# Patient Record
Sex: Female | Born: 1960 | Race: White | Hispanic: No | Marital: Married | Smoking: Never smoker
Health system: Southern US, Community
[De-identification: ages and names within clinical notes are randomized; demographics above are authoritative.]

## PROBLEM LIST (undated history)

## (undated) DIAGNOSIS — E119 Type 2 diabetes mellitus without complications: Secondary | ICD-10-CM

## (undated) DIAGNOSIS — K5790 Diverticulosis of intestine, part unspecified, without perforation or abscess without bleeding: Secondary | ICD-10-CM

## (undated) DIAGNOSIS — I10 Essential (primary) hypertension: Secondary | ICD-10-CM

## (undated) DIAGNOSIS — E039 Hypothyroidism, unspecified: Secondary | ICD-10-CM

## (undated) DIAGNOSIS — E785 Hyperlipidemia, unspecified: Secondary | ICD-10-CM

## (undated) HISTORY — DX: Diverticulosis of intestine, part unspecified, without perforation or abscess without bleeding: K57.90

## (undated) HISTORY — PX: COLONOSCOPY: SHX174

## (undated) HISTORY — DX: Type 2 diabetes mellitus without complications: E11.9

## (undated) HISTORY — DX: Hyperlipidemia, unspecified: E78.5

## (undated) HISTORY — DX: Hypothyroidism, unspecified: E03.9

## (undated) HISTORY — DX: Essential (primary) hypertension: I10

## (undated) HISTORY — PX: CHOLECYSTECTOMY: SHX55

## (undated) HISTORY — PX: CARDIAC CATHETERIZATION: SHX172

---

## 2017-10-08 DIAGNOSIS — E119 Type 2 diabetes mellitus without complications: Secondary | ICD-10-CM

## 2017-10-08 DIAGNOSIS — I10 Essential (primary) hypertension: Secondary | ICD-10-CM

## 2017-10-08 DIAGNOSIS — E785 Hyperlipidemia, unspecified: Secondary | ICD-10-CM

## 2017-10-08 DIAGNOSIS — K5790 Diverticulosis of intestine, part unspecified, without perforation or abscess without bleeding: Secondary | ICD-10-CM

## 2017-10-08 DIAGNOSIS — E039 Hypothyroidism, unspecified: Secondary | ICD-10-CM

## 2017-10-08 HISTORY — DX: Type 2 diabetes mellitus without complications: E11.9

## 2017-10-08 HISTORY — DX: Essential (primary) hypertension: I10

## 2017-10-08 HISTORY — DX: Hyperlipidemia, unspecified: E78.5

## 2017-10-08 HISTORY — DX: Diverticulosis of intestine, part unspecified, without perforation or abscess without bleeding: K57.90

## 2017-10-08 HISTORY — DX: Hypothyroidism, unspecified: E03.9

## 2017-11-19 ENCOUNTER — Ambulatory Visit: Payer: BC Managed Care – PPO | Admitting: Cardiology

## 2021-01-19 NOTE — Progress Notes (Signed)
Cardiology Office Note:    Date:  01/21/2021   ID:  Molly Wilkins, DOB 11/26/1960, MRN [id number]  PCP:  Alinda Deem, MD  Cardiologist:  Norman Herrlich, MD   Referring MD: Alinda Deem, MD  ASSESSMENT:    1. Chest pain, unspecified type   2. Primary hypertension   3. Mixed hyperlipidemia   4. Prediabetes    PLAN:    In order of problems listed above:  1. Is a difficult visit being unable to access her old records however I do not think she had significant CAD from their descriptions and I feel comfortable be able to access her old heart catheterization report.  Moving forward she will undergo cardiac CTA to define the presence or absence of CAD and guide further evaluation and treatment.  She is comfortable with the approach and knowledgeable that it may be delayed if we do have a serious shortage of contrast dye in the next 4 to 6 weeks. 2. Stable BP at target continue current treatment including ACE inhibitor 3. Stable continue her statin 4. Stable continue metformin, encouraged her to find other ways for activity including pool programs for recumbent bike with her hip and knee limitations  Next appointment 8 weeks  Medication Adjustments/Labs and Tests Ordered: Current medicines are reviewed at length with the patient today.  Concerns regarding medicines are outlined above.  Orders Placed This Encounter  Procedures  . CT CORONARY MORPH W/CTA COR W/SCORE W/CA W/CM &/OR WO/CM  . EKG 12-Lead   Meds ordered this encounter  Medications  . metoprolol tartrate (LOPRESSOR) 100 MG tablet    Sig: Take 1 tablet (100 mg total) by mouth once for 1 dose. Take two hours prior to your cardiac CT    Dispense:  1 tablet    Refill:  0     Chief Complaint  Patient presents with  . Chest Pain    History of Present Illness:    Molly Wilkins is a 60 y.o. female with a history of hypertension and hyperlipidemia who is being seen today for the evaluation of chest pain at the request of  Alinda Deem, MD.  Review office note from primary care physician 12/20/2020 relates that 3-year duration of recurrent chest pain.  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  Her husband is present participates in evaluation decision making. Their memory and she was told that her heart catheterization was good for her age there is they are unsure whether there is any particular abnormality was never told she had coronary artery disease. I think we will be able to access the report and its requested from [facility]. She had seen my partner Dr. Tomie China in the past She has retired from a very stressful job and is concerned about her cardiac prognosis. Although she does not have typical exertional angina or shortness of breath she has a great deal of exercise intolerance related to hip and knee pain at times just vaguely does not feel well can have momentary chest discomfort describes it as pins-and-needles with and without activity and some element of shortness of breath.  She is concerned that stress is the cause of her symptoms. She has had good medical care her lipids are at target last LDL 89 on high intensity statin A1c 6.3% and blood pressure at target.  She has been maintained on aspirin along with a statin. They would both like further evaluation. We reviewed the options including noninvasive imaging versus cardiac  CTA and we will go ahead and schedule as an outpatient for CTA evaluation.  She is aware that there is a Teacher, early years/pre of contrast dye and may be delayed 6 to 8 weeks and we all feel comfortable that she does not have unstable symptoms. She has no known history of congenital or rheumatic heart disease. At times she has intermittent edema no orthopnea no palpitation or syncope.  Past Medical History:  Diagnosis Date  . Diabetes mellitus (HCC) 10/08/2017  . Diverticulosis 10/08/2017  . Hyperlipidemia 10/08/2017  .  Hypertension 10/08/2017  . Hypothyroidism 10/08/2017    Past Surgical History:  Procedure Laterality Date  . CARDIAC CATHETERIZATION    . CHOLECYSTECTOMY    . COLONOSCOPY      Current Medications: Current Meds  Medication Sig  . aspirin EC 81 MG tablet Take 81 mg by mouth daily. Swallow whole.  . Coenzyme Q10 (CO Q-10) 50 MG CAPS Take 1 capsule by mouth daily.  Marland Kitchen levothyroxine (SYNTHROID) 112 MCG tablet Take 112 mcg by mouth every morning.  Marland Kitchen lisinopril (ZESTRIL) 10 MG tablet Take 1 tablet by mouth daily.  . melatonin 5 MG TABS Take 5 mg by mouth at bedtime as needed (INSOMNIA).  Marland Kitchen metFORMIN (GLUCOPHAGE) 500 MG tablet Take 1 tablet by mouth 2 (two) times daily.  . metoprolol tartrate (LOPRESSOR) 100 MG tablet Take 1 tablet (100 mg total) by mouth once for 1 dose. Take two hours prior to your cardiac CT  . Misc Natural Products (LUTEIN 20 PO) Take 1 tablet by mouth daily.  . rosuvastatin (CRESTOR) 20 MG tablet Take 20 mg by mouth daily.     Allergies:   Patient has no known allergies.   Social History   Socioeconomic History  . Marital status: Married    Spouse name: Not on file  . Number of children: Not on file  . Years of education: Not on file  . Highest education level: Not on file  Occupational History  . Not on file  Tobacco Use  . Smoking status: Never Smoker  . Smokeless tobacco: Never Used  Vaping Use  . Vaping Use: Never used  Substance and Sexual Activity  . Alcohol use: Yes    Comment: rarely  . Drug use: No  . Sexual activity: Not on file  Other Topics Concern  . Not on file  Social History Narrative  . Not on file   Social Determinants of Health   Financial Resource Strain: Not on file  Food Insecurity: Not on file  Transportation Needs: Not on file  Physical Activity: Not on file  Stress: Not on file  Social Connections: Not on file     Family History: The patient's family history includes CAD in her mother; COPD in her mother;  Hyperlipidemia in her mother; Hypertension in her brother and mother.  ROS:   ROS Please see the history of present illness.     All other systems reviewed and are negative.  EKGs/Labs/Other Studies Reviewed:    The following studies were reviewed today:   EKG:  EKG is  ordered today.  The ekg ordered today is personally reviewed and demonstrates sinus rhythm nonspecific T waves    Physical Exam:    VS:  BP (!) 114/58 (BP Location: Right Arm, Patient Position: Sitting)   Pulse 68   Ht 5\' 4"  (1.626 m)   Wt 228 lb 1.3 oz (103.5 kg)   SpO2 97%   BMI 39.15 kg/m  Wt Readings from Last 3 Encounters:  01/21/21 228 lb 1.3 oz (103.5 kg)     GEN: Obese well nourished, well developed in no acute distress HEENT: Normal NECK: No JVD; No carotid bruits LYMPHATICS: No lymphadenopathy CARDIAC: RRR, no murmurs, rubs, gallops RESPIRATORY:  Clear to auscultation without rales, wheezing or rhonchi  ABDOMEN: Soft, non-tender, non-distended MUSCULOSKELETAL:  No edema; No deformity  SKIN: Warm and dry NEUROLOGIC:  Alert and oriented x 3 PSYCHIATRIC:  Normal affect     Signed, Norman Herrlich, MD  01/21/2021 12:02 PM    [facility]

## 2021-01-21 ENCOUNTER — Ambulatory Visit: Payer: BC Managed Care – PPO | Admitting: Cardiology

## 2021-01-21 ENCOUNTER — Other Ambulatory Visit: Payer: Self-pay

## 2021-01-21 ENCOUNTER — Encounter: Payer: Self-pay | Admitting: Cardiology

## 2021-01-21 VITALS — BP 114/58 | HR 68 | Ht 64.0 in | Wt 228.1 lb

## 2021-01-21 DIAGNOSIS — R7303 Prediabetes: Secondary | ICD-10-CM | POA: Diagnosis not present

## 2021-01-21 DIAGNOSIS — E782 Mixed hyperlipidemia: Secondary | ICD-10-CM

## 2021-01-21 DIAGNOSIS — I10 Essential (primary) hypertension: Secondary | ICD-10-CM | POA: Diagnosis not present

## 2021-01-21 DIAGNOSIS — R079 Chest pain, unspecified: Secondary | ICD-10-CM

## 2021-01-21 MED ORDER — METOPROLOL TARTRATE 100 MG PO TABS: 100.0000 mg | ORAL_TABLET | Freq: Once | ORAL | 0 refills | Status: DC

## 2021-01-21 NOTE — Patient Instructions (Addendum)
Medication Instructions:  Your physician recommends that you continue on your current medications as directed. Please refer to the Current Medication list given to you today.  *If you need a refill on your cardiac medications before your next appointment, please call your pharmacy*   Lab Work: None If you have labs (blood work) drawn today and your tests are completely normal, you will receive your results only by: Marland Kitchen MyChart Message (if you have MyChart) OR . A paper copy in the mail If you have any lab test that is abnormal or we need to change your treatment, we will call you to review the results.   Testing/Procedures: Your cardiac CT will be scheduled at the below location:   [facility] [address] [place], [place] [address] [phone]  If scheduled at [facility], please arrive at the [facility] main entrance (entrance A) of [facility] 30 minutes prior to test start time. Proceed to the [facility] Rad[facility] Department (first floor) to check-in and test prep.  Please follow these instructions carefully (unless otherwise directed):  On the Night Before the Test: . Be sure to Drink plenty of water. . Do not consume any caffeinated/decaffeinated beverages or chocolate 12 hours prior to your test. . Do not take any antihistamines 12 hours prior to your test.  On the Day of the Test: . Drink plenty of water until 1 hour prior to the test. . Do not eat any food 4 hours prior to the test. . You may take your regular medications prior to the test.  . Take metoprolol (Lopressor) two hours prior to test. . FEMALES- please wear underwire-free bra if available      After the Test: . Drink plenty of water. . After receiving IV contrast, you may experience a mild flushed feeling. This is normal. . On occasion, you may experience a mild rash up to 24 hours after the test. This is not dangerous. If this occurs, you can take Benadryl 25 mg and  increase your fluid intake. . If you experience trouble breathing, this can be serious. If it is severe call 911 IMMEDIATELY. If it is mild, please call our office. . If you take any of these medications: Glipizide/Metformin, Avandament, Glucavance, please do not take 48 hours after completing test unless otherwise instructed.   Once we have confirmed authorization from your insurance company, we will call you to set up a date and time for your test. Based on how quickly your insurance processes prior authorizations requests, please allow up to 4 weeks to be contacted for scheduling your Cardiac CT appointment. Be advised that routine Cardiac CT appointments could be scheduled as many as 8 weeks after your provider has ordered it.  For non-scheduling related questions, please contact the cardiac imaging nurse navigator should you have any questions/concerns: Rockwell Alexandria, Cardiac Imaging Nurse Navigator Larey Brick, Cardiac Imaging Nurse Navigator [facility] Direct Office Dial: [phone]   For scheduling needs, including cancellations and rescheduling, please call [place], [phone].     Follow-Up: At [facility], you and your health needs are our priority.  As part of our continuing mission to provide you with exceptional heart care, we have created designated Provider Care Teams.  These Care Teams include your primary Cardiologist (physician) and Advanced Practice Providers (APPs -  Physician Assistants and Nurse Practitioners) who all work together to provide you with the care you need, when you need it.  We recommend  signing up for the patient portal called "MyChart".  Sign up information is provided on this After Visit Summary.  MyChart is used to connect with patients for Virtual Visits (Telemedicine).  Patients are able to view lab/test results, encounter notes, upcoming appointments, etc.  Non-urgent messages can be sent to your provider as well.    To learn more about what you can do with MyChart, go to [email or web address].    Your next appointment:   8 week(s)  The format for your next appointment:   In Person  Provider:   Norman Herrlich, MD   Other Instructions

## 2021-02-06 ENCOUNTER — Telehealth: Payer: Self-pay | Admitting: Cardiology

## 2021-02-06 DIAGNOSIS — I10 Essential (primary) hypertension: Secondary | ICD-10-CM

## 2021-02-06 NOTE — Telephone Encounter (Signed)
Patient called in to get clarification when she went to schedule for her CT scan that department told her that she needs to have lab work done and not CT. On the active request I do see ct and in the notes says labs. Patient didn't think she needs labs and don't know what labs need to be done. Please advise

## 2021-02-06 NOTE — Telephone Encounter (Signed)
Spoke to the patient just now and let her know that this test can take up to 6-8 weeks. She will need labs done within the week prior and I have placed the order for this but told her to wait until she was scheduled to have them completed within the week prior.    Encouraged patient to call back with any questions or concerns.

## 2021-02-25 ENCOUNTER — Other Ambulatory Visit: Payer: Self-pay

## 2021-02-25 DIAGNOSIS — I10 Essential (primary) hypertension: Secondary | ICD-10-CM

## 2021-02-26 ENCOUNTER — Telehealth: Payer: Self-pay | Admitting: Cardiology

## 2021-02-26 ENCOUNTER — Telehealth: Payer: Self-pay

## 2021-02-26 LAB — BASIC METABOLIC PANEL
BUN/Creatinine Ratio: 15 (ref 12–28)
BUN: 10 mg/dL (ref 8–27)
CO2: 24 mmol/L (ref 20–29)
Calcium: 9.5 mg/dL (ref 8.7–10.3)
Chloride: 104 mmol/L (ref 96–106)
Creatinine, Ser: 0.66 mg/dL (ref 0.57–1.00)
Glucose: 90 mg/dL (ref 65–99)
Potassium: 4.5 mmol/L (ref 3.5–5.2)
Sodium: 141 mmol/L (ref 134–144)
eGFR: 100 mL/min/{1.73_m2} (ref >59)

## 2021-02-26 NOTE — Telephone Encounter (Signed)
-----   Message from Baldo Daub, MD sent at 02/26/2021  8:38 AM EDT ----- Regarding: FW: Good result no changes in treatment ----- Message ----- From: Interface, [facility] Lab Results In Sent: 02/26/2021   6:22 AM EDT To: Baldo Daub, MD

## 2021-02-26 NOTE — Telephone Encounter (Signed)
Follow up:    Patient returning a call back for results 

## 2021-02-26 NOTE — Telephone Encounter (Signed)
Left message on patients voicemail to please return our call.   

## 2021-02-26 NOTE — Telephone Encounter (Signed)
Spoke with patient regarding results and recommendation.  Patient verbalizes understanding and is agreeable to plan of care. Advised patient to call back with any issues or concerns.  

## 2021-03-04 ENCOUNTER — Encounter (HOSPITAL_COMMUNITY): Payer: Self-pay | Admitting: Emergency Medicine

## 2021-03-04 ENCOUNTER — Telehealth (HOSPITAL_COMMUNITY): Payer: Self-pay | Admitting: Emergency Medicine

## 2021-03-04 ENCOUNTER — Telehealth: Payer: Self-pay | Admitting: Cardiology

## 2021-03-04 NOTE — Telephone Encounter (Signed)
Reaching out to patient to offer assistance regarding upcoming cardiac imaging study; pt verbalizes understanding of appt date/time, parking situation and where to check in, pre-test NPO status and medications ordered, and verified current allergies; name and call back number provided for further questions should they arise Olive Zmuda RN Navigator Cardiac Imaging [facility] [phone] office [phone] cell  100mg metoprolol tart 2 hr prior   

## 2021-03-04 NOTE — Telephone Encounter (Signed)
Attempted to call patient regarding upcoming cardiac CT appointment. °Left message on voicemail with name and callback number °Cornelious Bartolucci RN Navigator Cardiac Imaging °[facility] °[phone] Office °[phone] Cell ° °

## 2021-03-04 NOTE — Telephone Encounter (Signed)
Spoke to the patient just now and let her know that if she was unable to find a bra with no under-wire that she would just need to take it off before going into the CT machine. She verbalizes understanding.   She also states that someone was going to send her an email prior to her CT but she has not gotten it. I advised that we do not email patients and I am unsure what this would have been in regards to. She has all of her instructions with her and should be good to go for this CT.    Encouraged patient to call back with any questions or concerns.

## 2021-03-04 NOTE — Telephone Encounter (Signed)
Patient called to say that if she can find a bra with no wire in for her procedure what would she need to do. Patient also thought that a email was suppose to be send to her, but she hasnt received it yet. Please advise

## 2021-03-04 NOTE — Telephone Encounter (Signed)
Attempted to call patient regarding upcoming cardiac CT appointment. °Left message on voicemail with name and callback number °Lekia Nier RN Navigator Cardiac Imaging °[facility] °[phone] Office °[phone] Cell ° °

## 2021-03-06 ENCOUNTER — Other Ambulatory Visit: Payer: Self-pay | Admitting: Internal Medicine

## 2021-03-06 ENCOUNTER — Encounter (HOSPITAL_COMMUNITY): Payer: Self-pay

## 2021-03-06 ENCOUNTER — Other Ambulatory Visit: Payer: Self-pay

## 2021-03-06 ENCOUNTER — Ambulatory Visit (HOSPITAL_COMMUNITY)
Admission: RE | Admit: 2021-03-06 | Discharge: 2021-03-06 | Disposition: A | Discharge status: Home / Self Care | Payer: BC Managed Care – PPO | Source: Ambulatory Visit | Attending: Cardiology | Admitting: Cardiology

## 2021-03-06 DIAGNOSIS — R079 Chest pain, unspecified: Secondary | ICD-10-CM

## 2021-03-06 MED ORDER — NITROGLYCERIN 0.4 MG SL SUBL
0.8000 mg | SUBLINGUAL_TABLET | Freq: Once | SUBLINGUAL | Status: AC
  Administered 2021-03-06: 0.8 mg via SUBLINGUAL

## 2021-03-06 MED ORDER — IOHEXOL 350 MG/ML SOLN
100.0000 mL | Freq: Once | INTRAVENOUS | Status: AC | PRN
  Administered 2021-03-06: 100 mL via INTRAVENOUS

## 2021-03-06 MED ORDER — NITROGLYCERIN 0.4 MG SL SUBL
SUBLINGUAL_TABLET | SUBLINGUAL | Status: AC
  Filled 2021-03-06: qty 2

## 2021-03-06 NOTE — Progress Notes (Signed)
Erroneous encounter

## 2021-03-07 ENCOUNTER — Telehealth: Payer: Self-pay | Admitting: Cardiology

## 2021-03-07 ENCOUNTER — Encounter: Payer: Self-pay | Admitting: Cardiology

## 2021-03-07 ENCOUNTER — Telehealth: Payer: Self-pay

## 2021-03-07 NOTE — Telephone Encounter (Signed)
     Pt is calling back, she wanted to know if still necessary for he to go to her appt on 03/26/21 since she already know her result

## 2021-03-07 NOTE — Telephone Encounter (Signed)
New Message:     Please call, pt wants to know if Dr Dulce Sellar actually saw the scan and about her appointment.

## 2021-03-07 NOTE — Telephone Encounter (Signed)
Left message on patients voicemail to please return our call.   

## 2021-03-07 NOTE — Telephone Encounter (Signed)
Spoke with patient regarding results and recommendation.  Patient verbalizes understanding and is agreeable to plan of care. Advised patient to call back with any issues or concerns.  

## 2021-03-07 NOTE — Telephone Encounter (Signed)
-----   Message from Thomasene Ripple, DO sent at 03/07/2021 11:02 AM EDT ----- Is Dr. Hulen Shouts patient: Your cardiac CT scan showed mild coronary artery disease, no further testing is required at this time please continue aspirin 81 mg daily as well as your Crestor 20 mg daily. Noncardiac portion of the test did not show any incidental findings

## 2021-03-07 NOTE — Telephone Encounter (Signed)
Spoke to the patient just now and let her know that Dr. Servando Salina reviewed this test as mentioned earlier. I also let her know that she should keep her appointment to further discuss these results with Dr. Dulce Sellar and she verbalizes understanding.

## 2021-03-25 ENCOUNTER — Other Ambulatory Visit: Payer: Self-pay

## 2021-03-25 NOTE — Progress Notes (Signed)
Cardiology Office Note:    Date:  03/26/2021   ID:  Molly Wilkins, DOB August 15, 1961, MRN [id number]  PCP:  Alinda Deem, MD  Cardiologist:  Norman Herrlich, MD    Referring MD: Alinda Deem, MD    ASSESSMENT:    1. 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 Mild CAD   3. Primary hypertension   4. Mixed hyperlipidemia    PLAN:    In order of problems listed above:  She has a very high calcium score remain on high intensity statin, if intolerant she need PCSK9 inhibitor. She has infrequent angina more of stress emotional triggers we will put her on a low-dose of calcium channel blocker and nitroglycerin as needed BP at target continue current therapy ACE inhibitor Continue her high intensity statin   Next appointment: 1 year   Medication Adjustments/Labs and Tests Ordered: Current medicines are reviewed at length with the patient today.  Concerns regarding medicines are outlined above.  No orders of the defined types were placed in this encounter.  No orders of the defined types were placed in this encounter.   Chief complaint: Follow-up after cardiac CTA   History of Present Illness:    Molly Wilkins is a 60 y.o. female with a hx of hypertension hyperlipidemia last seen 01/21/2021 for chest pain.  There is notation in the medical record that she had nonobstructive CAD coronary angiography 2011. She has had good medical care her lipids are at target last LDL 89 on high intensity statin A1c 6.3% and blood pressure at target  Compliance with diet, lifestyle and medications: Yes  Her husband is present participates in evaluation decision making. She has had no recent angina but the history as she gets clusters of symptoms with emotional triggers. I have advised her I think she would benefit from medical therapy we will put her on a low-dose of rate limiting calcium channel blocker and a prescription for nitroglycerin. She understands the necessity of  remaining on a high intensity statin.  She underwent cardiac CTA reported 03/06/2021 showing a calcium score of 153 92nd percentile for age and sex minimal to mild mixed nonobstructive CAD involving left anterior descending coronary artery left circumflex coronary artery first marginal branch. Past Medical History:  Diagnosis Date   Diabetes mellitus (HCC) 10/08/2017   Diverticulosis 10/08/2017   Hyperlipidemia 10/08/2017   Hypertension 10/08/2017   Hypothyroidism 10/08/2017    Past Surgical History:  Procedure Laterality Date   CARDIAC CATHETERIZATION     CHOLECYSTECTOMY     COLONOSCOPY      Current Medications: Current Meds  Medication Sig   aspirin EC 81 MG tablet Take 81 mg by mouth daily. Swallow whole.   Coenzyme Q10 (CO Q-10) 50 MG CAPS Take 1 capsule by mouth daily.   levothyroxine (SYNTHROID) 112 MCG tablet Take 112 mcg by mouth every morning.   lisinopril (ZESTRIL) 10 MG tablet Take 1 tablet by mouth daily.   melatonin 5 MG TABS Take 5 mg by mouth at bedtime as needed for sleep (INSOMNIA).   metFORMIN (GLUCOPHAGE) 500 MG tablet Take 1 tablet by mouth 2 (two) times daily.   Misc Natural Products (LUTEIN 20 PO) Take 1 tablet by mouth daily.   rosuvastatin (CRESTOR) 20 MG tablet Take 20 mg by mouth daily.     Allergies:   Patient has no known allergies.   Social History   Socioeconomic History   Marital status: Married    Spouse name: Not  on file   Number of children: Not on file   Years of education: Not on file   Highest education level: Not on file  Occupational History   Not on file  Tobacco Use   Smoking status: Never   Smokeless tobacco: Never  Vaping Use   Vaping Use: Never used  Substance and Sexual Activity   Alcohol use: Yes    Comment: rarely   Drug use: No   Sexual activity: Not on file  Other Topics Concern   Not on file  Social History Narrative   Not on file   Social Determinants of Health   Financial Resource Strain: Not on file  Food  Insecurity: Not on file  Transportation Needs: Not on file  Physical Activity: Not on file  Stress: Not on file  Social Connections: Not on file     Family History: The patient's family history includes CAD in her mother; COPD in her mother; Hyperlipidemia in her mother; Hypertension in her brother and mother. ROS:   Please see the history of present illness.    All other systems reviewed and are negative.  EKGs/Labs/Other Studies Reviewed:    The following studies were reviewed today:    Recent Labs: 02/25/2021: BUN 10; Creatinine, Ser 0.66; Potassium 4.5; Sodium 141    Physical Exam:    VS:  BP 116/70 (BP Location: Right Arm, Patient Position: Sitting, Cuff Size: Large)   Pulse 60   Ht 5\' 4"  (1.626 m)   Wt 228 lb 3.2 oz (103.5 kg)   SpO2 95%   BMI 39.17 kg/m     Wt Readings from Last 3 Encounters:  03/26/21 228 lb 3.2 oz (103.5 kg)  01/21/21 228 lb 1.3 oz (103.5 kg)     GEN:  Well nourished, well developed in no acute distress HEENT: Normal NECK: No JVD; No carotid bruits LYMPHATICS: No lymphadenopathy CARDIAC: RRR, no murmurs, rubs, gallops RESPIRATORY:  Clear to auscultation without rales, wheezing or rhonchi  ABDOMEN: Soft, non-tender, non-distended MUSCULOSKELETAL:  No edema; No deformity  SKIN: Warm and dry NEUROLOGIC:  Alert and oriented x 3 PSYCHIATRIC:  Normal affect    Signed, 03/23/21, MD  03/26/2021 10:32 AM    [facility]

## 2021-03-26 ENCOUNTER — Ambulatory Visit: Payer: BC Managed Care – PPO | Admitting: Cardiology

## 2021-03-26 ENCOUNTER — Other Ambulatory Visit: Payer: Self-pay

## 2021-03-26 ENCOUNTER — Encounter: Payer: Self-pay | Admitting: Cardiology

## 2021-03-26 VITALS — BP 116/70 | HR 60 | Ht 64.0 in | Wt 228.2 lb

## 2021-03-26 DIAGNOSIS — I10 Essential (primary) hypertension: Secondary | ICD-10-CM

## 2021-03-26 DIAGNOSIS — I2510 Atherosclerotic heart disease of native coronary artery without angina pectoris: Secondary | ICD-10-CM

## 2021-03-26 DIAGNOSIS — R931 Abnormal findings on diagnostic imaging of heart and coronary circulation: Secondary | ICD-10-CM

## 2021-03-26 DIAGNOSIS — E782 Mixed hyperlipidemia: Secondary | ICD-10-CM

## 2021-03-26 MED ORDER — NITROGLYCERIN 0.4 MG SL SUBL: 0.4000 mg | SUBLINGUAL_TABLET | SUBLINGUAL | 6 refills | Status: DC | PRN

## 2021-03-26 MED ORDER — DILTIAZEM HCL ER COATED BEADS 120 MG PO CP24: 120.0000 mg | ORAL_CAPSULE | Freq: Every day | ORAL | 3 refills | Status: DC

## 2021-03-26 NOTE — Patient Instructions (Signed)
Medication Instructions:  Your physician has recommended you make the following change in your medication:   Start Cardizem CD 120 mg daily.  Use nitroglycerin 1 tablet placed under the tongue at the first sign of chest pain or an angina attack. 1 tablet may be used every 5 minutes as needed, for up to 15 minutes. Do not take more than 3 tablets in 15 minutes. If pain persist call 911 or go to the nearest ED.    *If you need a refill on your cardiac medications before your next appointment, please call your pharmacy*   Lab Work: None ordered If you have labs (blood work) drawn today and your tests are completely normal, you will receive your results only by: MyChart Message (if you have MyChart) OR A paper copy in the mail If you have any lab test that is abnormal or we need to change your treatment, we will call you to review the results.   Testing/Procedures: None ordered   Follow-Up: At [facility], you and your health needs are our priority.  As part of our continuing mission to provide you with exceptional heart care, we have created designated Provider Care Teams.  These Care Teams include your primary Cardiologist (physician) and Advanced Practice Providers (APPs -  Physician Assistants and Nurse Practitioners) who all work together to provide you with the care you need, when you need it.  We recommend signing up for the patient portal called "MyChart".  Sign up information is provided on this After Visit Summary.  MyChart is used to connect with patients for Virtual Visits (Telemedicine).  Patients are able to view lab/test results, encounter notes, upcoming appointments, etc.  Non-urgent messages can be sent to your provider as well.   To learn more about what you can do with MyChart, go to [email or web address].    Your next appointment:   12 month(s)  The format for your next appointment:   In Person  Provider:   Norman Herrlich, MD   Other Instructions NA

## 2021-08-21 ENCOUNTER — Other Ambulatory Visit: Payer: Self-pay | Admitting: Family Medicine

## 2021-08-21 DIAGNOSIS — Z1231 Encounter for screening mammogram for malignant neoplasm of breast: Secondary | ICD-10-CM

## 2021-08-28 ENCOUNTER — Ambulatory Visit
Admission: RE | Admit: 2021-08-28 | Discharge: 2021-08-28 | Disposition: A | Discharge status: Home / Self Care | Payer: BC Managed Care – PPO | Source: Ambulatory Visit | Attending: Family Medicine | Admitting: Family Medicine

## 2021-08-28 DIAGNOSIS — Z1231 Encounter for screening mammogram for malignant neoplasm of breast: Secondary | ICD-10-CM

## 2022-03-31 ENCOUNTER — Other Ambulatory Visit: Payer: Self-pay | Admitting: Cardiology

## 2022-03-31 DIAGNOSIS — I2510 Atherosclerotic heart disease of native coronary artery without angina pectoris: Secondary | ICD-10-CM

## 2022-03-31 DIAGNOSIS — I10 Essential (primary) hypertension: Secondary | ICD-10-CM

## 2022-04-29 ENCOUNTER — Other Ambulatory Visit: Payer: Self-pay | Admitting: Cardiology

## 2022-04-29 DIAGNOSIS — I10 Essential (primary) hypertension: Secondary | ICD-10-CM

## 2022-04-29 DIAGNOSIS — I2510 Atherosclerotic heart disease of native coronary artery without angina pectoris: Secondary | ICD-10-CM

## 2022-04-29 NOTE — Telephone Encounter (Signed)
Rx refill sent to pharmacy. 

## 2022-05-31 ENCOUNTER — Other Ambulatory Visit: Payer: Self-pay | Admitting: Cardiology

## 2022-05-31 DIAGNOSIS — I10 Essential (primary) hypertension: Secondary | ICD-10-CM

## 2022-05-31 DIAGNOSIS — I2510 Atherosclerotic heart disease of native coronary artery without angina pectoris: Secondary | ICD-10-CM

## 2022-06-30 ENCOUNTER — Other Ambulatory Visit: Payer: Self-pay

## 2022-06-30 ENCOUNTER — Telehealth: Payer: Self-pay | Admitting: Cardiology

## 2022-06-30 DIAGNOSIS — I10 Essential (primary) hypertension: Secondary | ICD-10-CM

## 2022-06-30 DIAGNOSIS — I2510 Atherosclerotic heart disease of native coronary artery without angina pectoris: Secondary | ICD-10-CM

## 2022-06-30 MED ORDER — DILTIAZEM HCL ER COATED BEADS 120 MG PO CP24: 120.0000 mg | ORAL_CAPSULE | Freq: Every day | ORAL | 0 refills | Status: DC

## 2022-06-30 NOTE — Telephone Encounter (Signed)
Cardizem CD 120mg  daily #90 0 ref . Has appt in January.

## 2022-06-30 NOTE — Telephone Encounter (Signed)
Patient states she leaves for a trip tomorrow and needs this medication today. Patient has an appt scheduled for 01/03. Please advise.

## 2022-06-30 NOTE — Telephone Encounter (Signed)
Refill sent to pharmacy with message patient must keep scheduled 09/17/2022 appointment for future refills / final attempt

## 2022-07-18 ENCOUNTER — Other Ambulatory Visit: Payer: Self-pay | Admitting: Family Medicine

## 2022-07-18 DIAGNOSIS — Z1231 Encounter for screening mammogram for malignant neoplasm of breast: Secondary | ICD-10-CM

## 2022-09-01 ENCOUNTER — Ambulatory Visit
Admission: RE | Admit: 2022-09-01 | Discharge: 2022-09-01 | Disposition: A | Discharge status: Home / Self Care | Payer: BC Managed Care – PPO | Source: Ambulatory Visit | Attending: Family Medicine | Admitting: Family Medicine

## 2022-09-01 DIAGNOSIS — Z1231 Encounter for screening mammogram for malignant neoplasm of breast: Secondary | ICD-10-CM

## 2022-09-16 NOTE — Progress Notes (Unsigned)
Cardiology Office Note:    Date:  09/17/2022   ID:  Molly Wilkins, DOB November 23, 1960, MRN [id number]  PCP:  Greig Right, MD  Cardiologist:  Shirlee More, MD    Referring MD: Greig Right, MD    ASSESSMENT:    1. Mild CAD   2. Agatston coronary artery calcium score between 100 and 199   3. Primary hypertension   4. Mixed hyperlipidemia    PLAN:    In order of problems listed above:  She has done well she will continue treatment including aspirin rate limiting calcium channel blocker and high intensity statin. Blood pressure well-controlled continue ACE inhibitor LDL at target she will continue statin at her PCP office Labs are followed  Next appointment: 1 year   Medication Adjustments/Labs and Tests Ordered: Current medicines are reviewed at length with the patient today.  Concerns regarding medicines are outlined above.  Orders Placed This Encounter  Procedures   EKG 12-Lead   Meds ordered this encounter  Medications   nitroGLYCERIN (NITROSTAT) 0.4 MG SL tablet    Sig: Place 1 tablet (0.4 mg total) under the tongue every 5 (five) minutes as needed for chest pain.    Dispense:  25 tablet    Refill:  1    Chief Complaint  Patient presents with   Follow-up   Coronary Artery Disease    History of Present Illness:    Molly Wilkins is a 62 y.o. female with a hx of chest pain with nonobstructive CAD on coronary angiography in 2011 hypertension hyperlipidemia last seen 03/26/2021.  She underwent cardiac CTA reported 03/06/2021 showing a calcium score of 153 92nd percentile for age and sex minimal to mild mixed nonobstructive CAD involving left anterior descending coronary artery left circumflex coronary artery first marginal branch.  Compliance with diet, lifestyle and medications: Yes   She had COVID for the second time over Christmas she took Paxlovid quick recovery and held her calcium channel blocker and statin until she finished the prescription She has  recovered At times again very mild vague momentary chest discomfort but is not having significant angina has not needed nitroglycerin no edema shortness of breath palpitation or syncope She is on good therapy with aspirin rate limiting calcium channel blocker and high intensity statin has had no muscle pain or weakness  Recent labs 12/17/2021 cholesterol 157 LDL 69 A1c 5.8 hemoglobin 13.4 creatinine 0.66 potassium 4.4 Past Medical History:  Diagnosis Date   Diabetes mellitus ([place]) 10/08/2017   Diverticulosis 10/08/2017   Hyperlipidemia 10/08/2017   Hypertension 10/08/2017   Hypothyroidism 10/08/2017    Past Surgical History:  Procedure Laterality Date   CARDIAC CATHETERIZATION     CHOLECYSTECTOMY     COLONOSCOPY      Current Medications: Current Meds  Medication Sig   aspirin EC 81 MG tablet Take 81 mg by mouth daily. Swallow whole.   Coenzyme Q10 (CO Q-10) 50 MG CAPS Take 1 capsule by mouth daily.   levothyroxine (SYNTHROID) 112 MCG tablet Take 112 mcg by mouth every morning.   lisinopril (ZESTRIL) 10 MG tablet Take 1 tablet by mouth daily.   melatonin 5 MG TABS Take 5 mg by mouth at bedtime as needed for sleep (INSOMNIA).   metFORMIN (GLUCOPHAGE) 500 MG tablet Take 1 tablet by mouth 2 (two) times daily.   Misc Natural Products (LUTEIN 20 PO) Take 1 tablet by mouth daily.   rosuvastatin (CRESTOR) 20 MG tablet Take 20 mg by mouth daily.   [DISCONTINUED] diltiazem (CARDIZEM  CD) 120 MG 24 hr capsule Take 1 capsule (120 mg total) by mouth daily. Must keep scheduled 09/17/22 appointment for future refills / Final attempt   [DISCONTINUED] nitroGLYCERIN (NITROSTAT) 0.4 MG SL tablet Place 1 tablet (0.4 mg total) under the tongue every 5 (five) minutes as needed.     Allergies:   Patient has no known allergies.   Social History   Socioeconomic History   Marital status: Married    Spouse name: Not on file   Number of children: Not on file   Years of education: Not on file   Highest  education level: Not on file  Occupational History   Not on file  Tobacco Use   Smoking status: Never   Smokeless tobacco: Never  Vaping Use   Vaping Use: Never used  Substance and Sexual Activity   Alcohol use: Yes    Comment: rarely   Drug use: No   Sexual activity: Not on file  Other Topics Concern   Not on file  Social History Narrative   Not on file   Social Determinants of Health   Financial Resource Strain: Not on file  Food Insecurity: Not on file  Transportation Needs: Not on file  Physical Activity: Not on file  Stress: Not on file  Social Connections: Not on file     Family History: The patient's family history includes CAD in her mother; COPD in her mother; Hyperlipidemia in her mother; Hypertension in her brother and mother. There is no history of Breast cancer. ROS:   Please see the history of present illness.    All other systems reviewed and are negative.  EKGs/Labs/Other Studies Reviewed:    The following studies were reviewed today:  EKG:  EKG ordered today and personally reviewed.  The ekg ordered today demonstrates sinus rhythm nonspecific T waves otherwise   Physical Exam:    VS:  BP 136/82 (BP Location: Right Arm, Patient Position: Sitting)   Pulse 64   Ht 5\' 4"  (1.626 m)   Wt 226 lb (102.5 kg)   SpO2 98%   BMI 38.79 kg/m     Wt Readings from Last 3 Encounters:  09/17/22 226 lb (102.5 kg)  03/26/21 228 lb 3.2 oz (103.5 kg)  01/21/21 228 lb 1.3 oz (103.5 kg)     GEN:  Well nourished, well developed in no acute distress HEENT: Normal NECK: No JVD; No carotid bruits LYMPHATICS: No lymphadenopathy CARDIAC: RRR, no murmurs, rubs, gallops RESPIRATORY:  Clear to auscultation without rales, wheezing or rhonchi  ABDOMEN: Soft, non-tender, non-distended MUSCULOSKELETAL:  No edema; No deformity  SKIN: Warm and dry NEUROLOGIC:  Alert and oriented x 3 PSYCHIATRIC:  Normal affect    Signed, Shirlee More, MD  09/17/2022 12:08 PM    [place]

## 2022-09-17 ENCOUNTER — Other Ambulatory Visit: Payer: Self-pay

## 2022-09-17 ENCOUNTER — Ambulatory Visit: Payer: BC Managed Care – PPO | Attending: Cardiology | Admitting: Cardiology

## 2022-09-17 ENCOUNTER — Encounter: Payer: Self-pay | Admitting: Cardiology

## 2022-09-17 VITALS — BP 136/82 | HR 64 | Ht 64.0 in | Wt 226.0 lb

## 2022-09-17 DIAGNOSIS — R931 Abnormal findings on diagnostic imaging of heart and coronary circulation: Secondary | ICD-10-CM

## 2022-09-17 DIAGNOSIS — I10 Essential (primary) hypertension: Secondary | ICD-10-CM | POA: Diagnosis not present

## 2022-09-17 DIAGNOSIS — E782 Mixed hyperlipidemia: Secondary | ICD-10-CM

## 2022-09-17 DIAGNOSIS — I2510 Atherosclerotic heart disease of native coronary artery without angina pectoris: Secondary | ICD-10-CM

## 2022-09-17 MED ORDER — DILTIAZEM HCL ER COATED BEADS 120 MG PO CP24: 120.0000 mg | ORAL_CAPSULE | Freq: Every day | ORAL | 4 refills | Status: DC

## 2022-09-17 MED ORDER — NITROGLYCERIN 0.4 MG SL SUBL: 0.4000 mg | SUBLINGUAL_TABLET | SUBLINGUAL | 1 refills | Status: DC | PRN

## 2022-09-17 NOTE — Telephone Encounter (Signed)
Refills sent to pharmacy. 

## 2022-09-17 NOTE — Patient Instructions (Signed)
Medication Instructions:  Your physician recommends that you continue on your current medications as directed. Please refer to the Current Medication list given to you today.  *If you need a refill on your cardiac medications before your next appointment, please call your pharmacy*   Lab Work: None If you have labs (blood work) drawn today and your tests are completely normal, you will receive your results only by: MyChart Message (if you have MyChart) OR A paper copy in the mail If you have any lab test that is abnormal or we need to change your treatment, we will call you to review the results.   Testing/Procedures: None   Follow-Up: At [facility], you and your health needs are our priority.  As part of our continuing mission to provide you with exceptional heart care, we have created designated Provider Care Teams.  These Care Teams include your primary Cardiologist (physician) and Advanced Practice Providers (APPs -  Physician Assistants and Nurse Practitioners) who all work together to provide you with the care you need, when you need it.  We recommend signing up for the patient portal called "MyChart".  Sign up information is provided on this After Visit Summary.  MyChart is used to connect with patients for Virtual Visits (Telemedicine).  Patients are able to view lab/test results, encounter notes, upcoming appointments, etc.  Non-urgent messages can be sent to your provider as well.   To learn more about what you can do with MyChart, go to [email or web address].    Your next appointment:   1 year(s)  The format for your next appointment:   In Person  Provider:   Brian Munley, MD    Other Instructions None  Important Information About Sugar       

## 2023-04-02 IMAGING — MG MM DIGITAL SCREENING BILAT W/ TOMO AND CAD
8 series · 8 of 24 positions shown · non-contrast
Comparison: Previous exam(s).

CLINICAL DATA: Screening.

EXAM:
DIGITAL SCREENING BILATERAL MAMMOGRAM WITH TOMOSYNTHESIS AND CAD
TECHNIQUE: Bilateral screening digital craniocaudal and mediolateral oblique
mammograms were obtained. Bilateral screening digital breast
tomosynthesis was performed. The images were evaluated with
computer-aided detection.

[R MLO synth-2D]
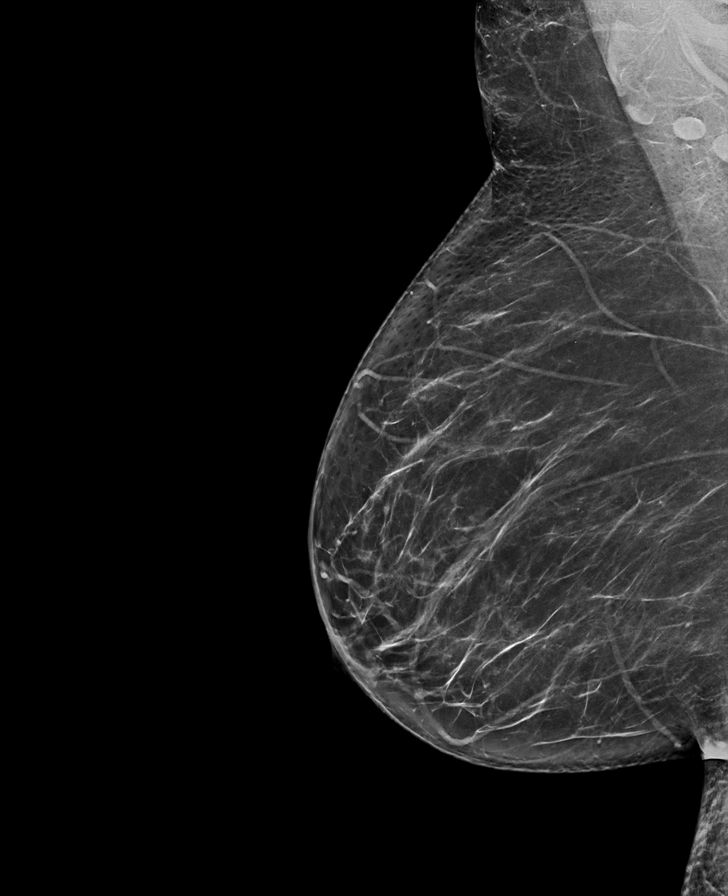

[R CC synth-2D]
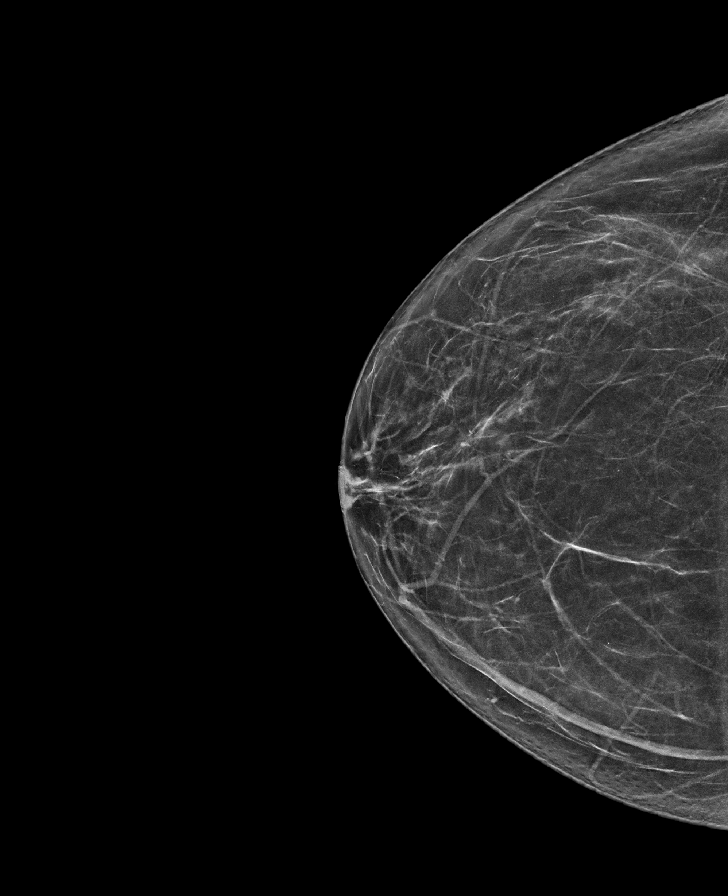

[L MLO synth-2D]
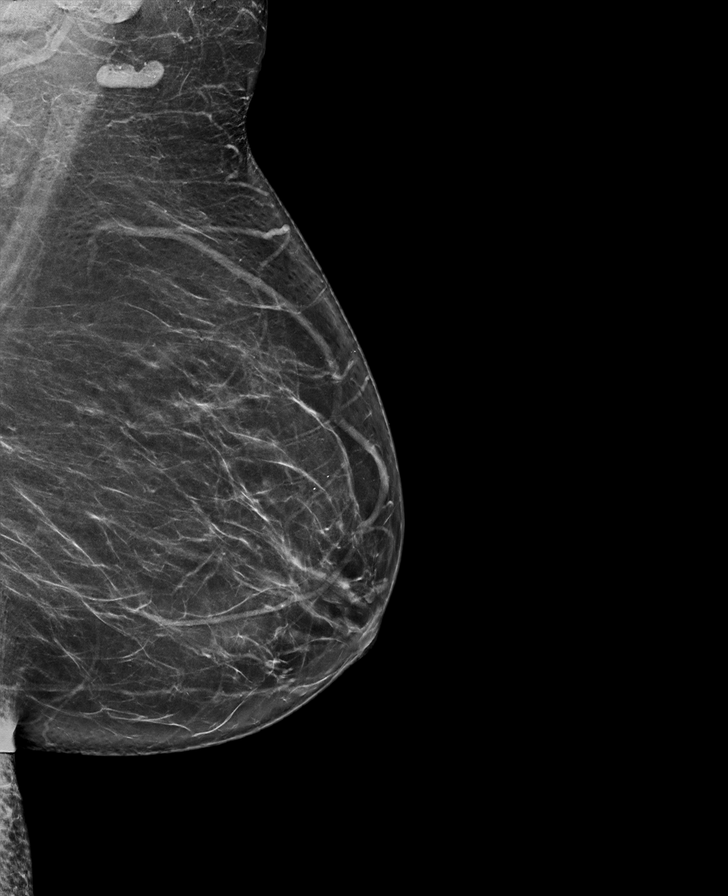

[L CC synth-2D]
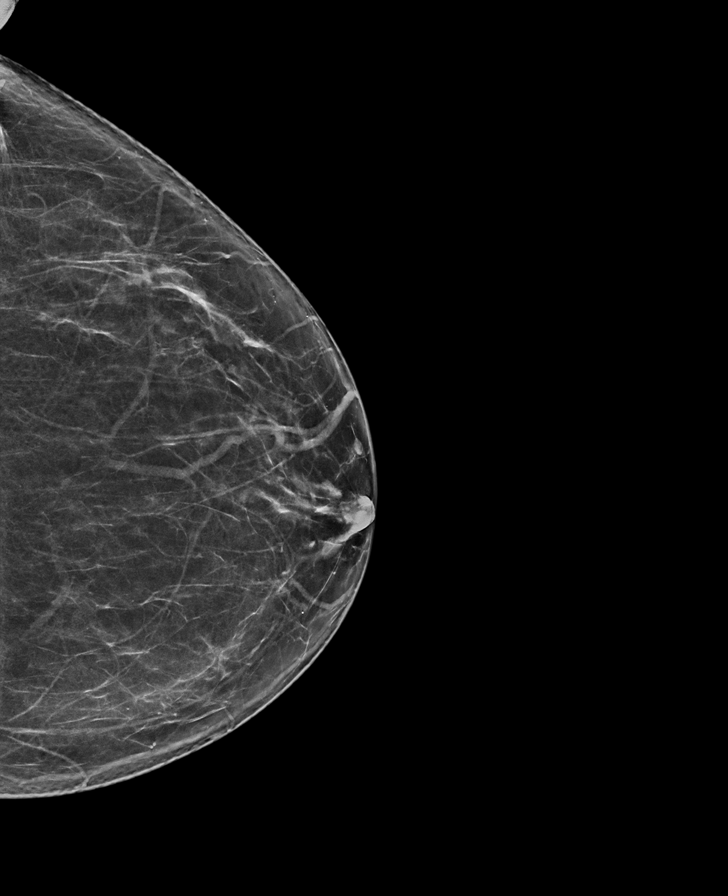

[L CC tomo · tomo slice 35/69.0]
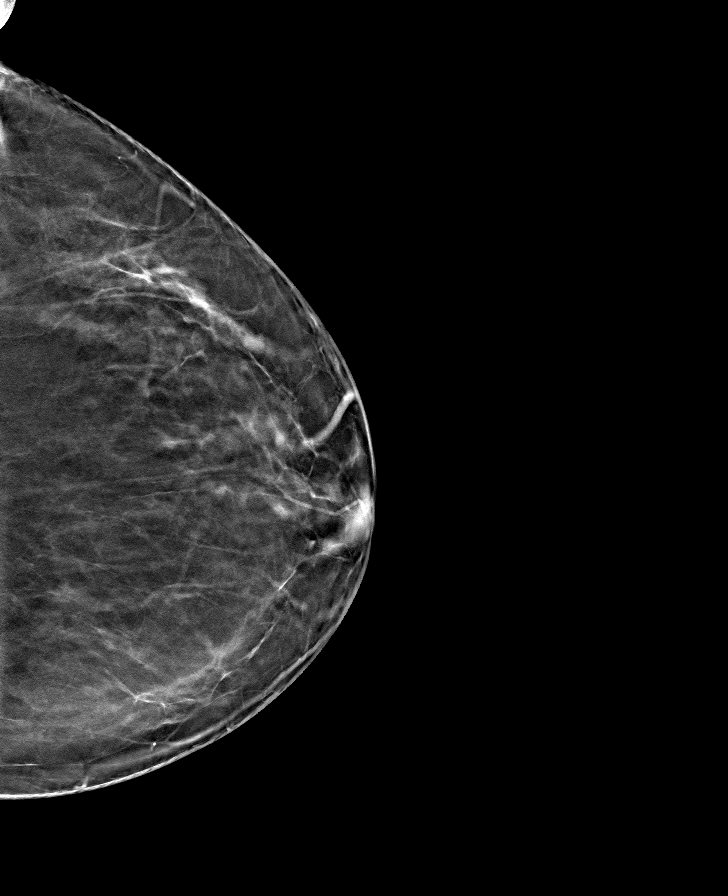

[R MLO tomo · tomo slice 40/79.0]
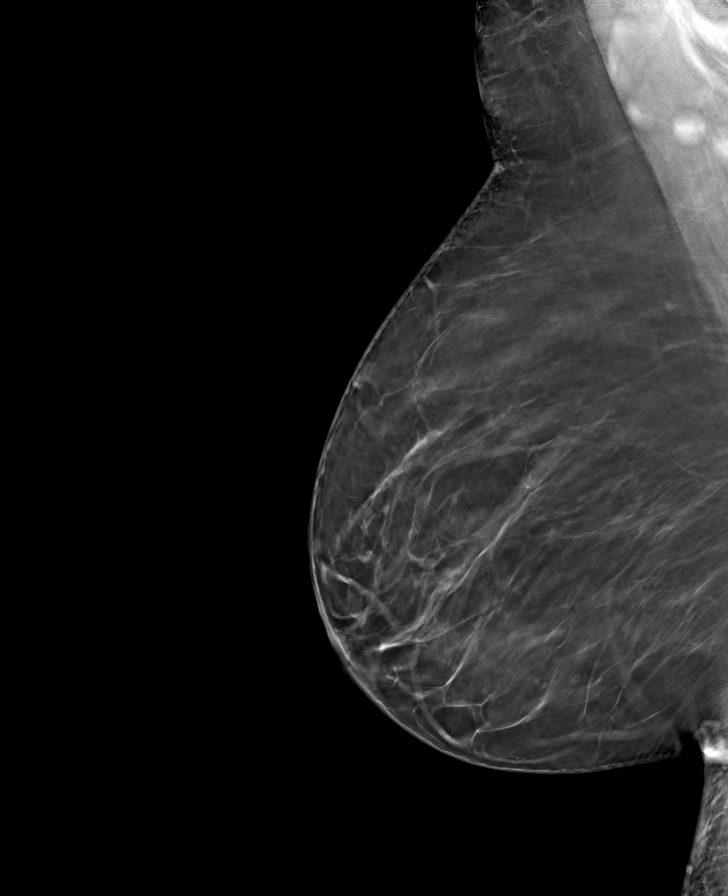

[R CC tomo · tomo slice 35/69.0]
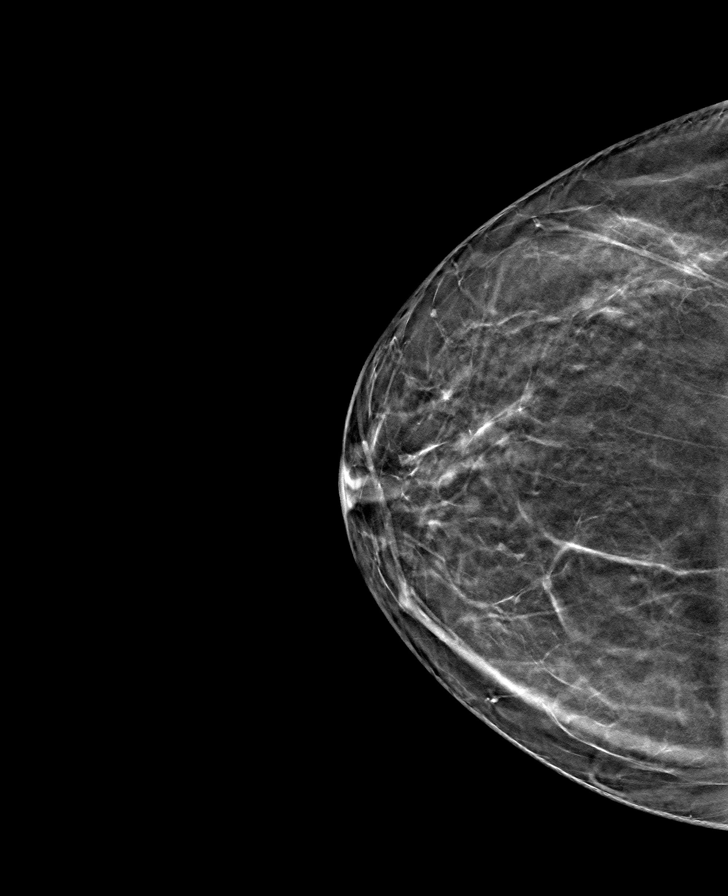

[L MLO tomo · tomo slice 39/76.0]
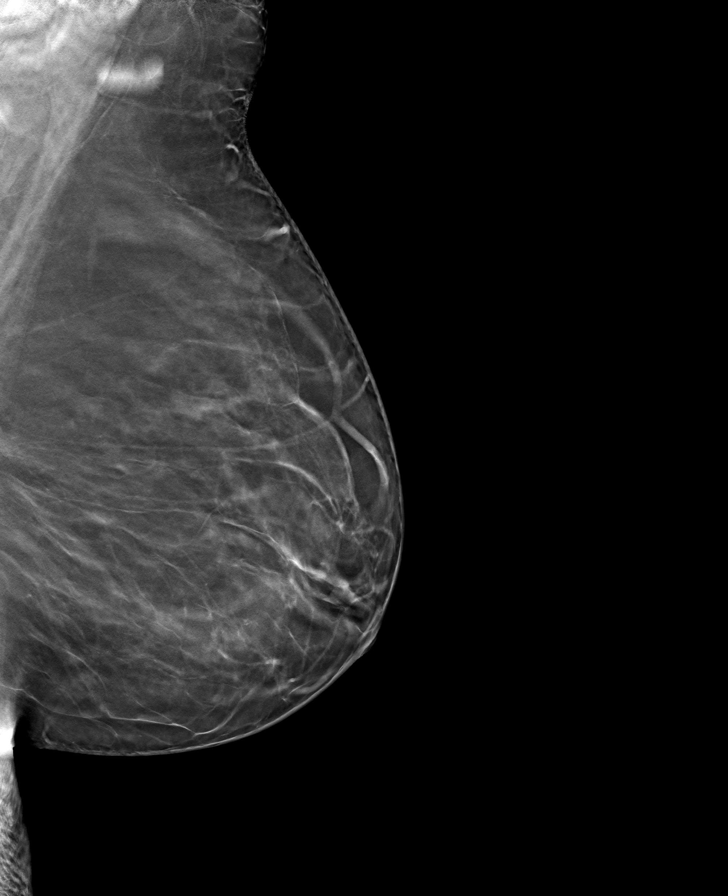

[8 of 24 positions shown; findings below may reference images not displayed]

ACR Breast Density Category b: There are scattered areas of
fibroglandular density.
FINDINGS: There are no findings suspicious for malignancy.
IMPRESSION: No mammographic evidence of malignancy. A result letter of this
screening mammogram will be mailed directly to the patient.

RECOMMENDATION:
Screening mammogram in one year. (Code:51-O-LD2)

BI-RADS CATEGORY  1: Negative.

## 2023-07-08 ENCOUNTER — Other Ambulatory Visit: Payer: Self-pay | Admitting: Family Medicine

## 2023-07-08 DIAGNOSIS — Z1231 Encounter for screening mammogram for malignant neoplasm of breast: Secondary | ICD-10-CM

## 2023-09-23 ENCOUNTER — Ambulatory Visit
Admission: RE | Admit: 2023-09-23 | Discharge: 2023-09-23 | Disposition: A | Discharge status: Home / Self Care | Payer: 59 | Source: Ambulatory Visit | Attending: Family Medicine | Admitting: Family Medicine

## 2023-09-23 DIAGNOSIS — Z1231 Encounter for screening mammogram for malignant neoplasm of breast: Secondary | ICD-10-CM

## 2023-09-27 ENCOUNTER — Other Ambulatory Visit: Payer: Self-pay | Admitting: Cardiology

## 2023-09-27 DIAGNOSIS — I2510 Atherosclerotic heart disease of native coronary artery without angina pectoris: Secondary | ICD-10-CM

## 2023-09-27 DIAGNOSIS — I10 Essential (primary) hypertension: Secondary | ICD-10-CM

## 2023-10-07 ENCOUNTER — Ambulatory Visit: Payer: 59 | Attending: Cardiology | Admitting: Cardiology

## 2023-10-07 ENCOUNTER — Encounter: Payer: Self-pay | Admitting: Cardiology

## 2023-10-07 VITALS — BP 132/76 | HR 69 | Ht 64.0 in | Wt 216.8 lb

## 2023-10-07 DIAGNOSIS — I2510 Atherosclerotic heart disease of native coronary artery without angina pectoris: Secondary | ICD-10-CM

## 2023-10-07 DIAGNOSIS — I10 Essential (primary) hypertension: Secondary | ICD-10-CM

## 2023-10-07 DIAGNOSIS — E782 Mixed hyperlipidemia: Secondary | ICD-10-CM | POA: Diagnosis not present

## 2023-10-07 DIAGNOSIS — R931 Abnormal findings on diagnostic imaging of heart and coronary circulation: Secondary | ICD-10-CM

## 2023-10-07 MED ORDER — NITROGLYCERIN 0.4 MG SL SUBL: 0.4000 mg | SUBLINGUAL_TABLET | SUBLINGUAL | 3 refills | Status: AC | PRN

## 2023-10-07 NOTE — Progress Notes (Signed)
Cardiology Office Note:    Date:  10/07/2023   ID:  Molly Wilkins, DOB Aug 17, 1961, MRN [id number]  PCP:  Alinda Deem, MD  Cardiologist:  Norman Herrlich, MD    Referring MD: Alinda Deem, MD    ASSESSMENT:    1. Mild CAD   2. Agatston coronary artery calcium score between 100 and 199   3. Primary hypertension   4. Mixed hyperlipidemia    PLAN:    In order of problems listed above:  She continues to do well mild nonobstructive CAD no anginal discomfort on good medical therapy including aspirin rate limiting calcium channel blocker lisinopril for hypertension or high density statin with an ideal lipid profile I encouraged her regular activity either side greater than 6500 steps a day or 20 to 30 minutes of activity daily If interested we could do a vascular screening at our office at a price similar or less than [facility] screening she will consider   Next appointment: 1 year   Medication Adjustments/Labs and Tests Ordered: Current medicines are reviewed at length with the patient today.  Concerns regarding medicines are outlined above.  Orders Placed This Encounter  Procedures   EKG 12-Lead   Meds ordered this encounter  Medications   nitroGLYCERIN (NITROSTAT) 0.4 MG SL tablet    Sig: Place 1 tablet (0.4 mg total) under the tongue every 5 (five) minutes as needed for chest pain.    Dispense:  25 tablet    Refill:  3     History of Present Illness:    Molly Wilkins is a 63 y.o. female with a hx of mild nonobstructive CAD on cardiac CTA 03/06/2021 with a calcium score 153 92nd percentile hypertension and hyperlipidemia last seen 09/17/2022.  Compliance with diet, lifestyle and medications: Yes  She has done well from a cardiology perspective has had no angina edema shortness of breath orthopnea palpitation or syncope she Her statin without muscle pain or weakness and labs 07/20/2023 cholesterol 142 LDL 69 non-HDL cholesterol 82 A1c 6.1 hemoglobin 13.0 creatinine 0.68  potassium Past Medical History:  Diagnosis Date   Diabetes mellitus ([facility]) 10/08/2017   Diverticulosis 10/08/2017   Hyperlipidemia 10/08/2017   Hypertension 10/08/2017   Hypothyroidism 10/08/2017    Current Medications: Current Meds  Medication Sig   aspirin EC 81 MG tablet Take 81 mg by mouth daily. Swallow whole.   Coenzyme Q10 (CO Q-10) 50 MG CAPS Take 1 capsule by mouth daily.   diltiazem (CARDIZEM CD) 120 MG 24 hr capsule Take 1 capsule (120 mg total) by mouth daily. 1rst attempt, patient needs and appt for additional refills   levothyroxine (SYNTHROID) 112 MCG tablet Take 112 mcg by mouth every morning.   lisinopril (ZESTRIL) 10 MG tablet Take 1 tablet by mouth daily.   melatonin 5 MG TABS Take 5 mg by mouth at bedtime as needed for sleep (INSOMNIA).   metFORMIN (GLUCOPHAGE) 500 MG tablet Take 1 tablet by mouth 2 (two) times daily.   Misc Natural Products (LUTEIN 20 PO) Take 1 tablet by mouth daily.   nitroGLYCERIN (NITROSTAT) 0.4 MG SL tablet Place 1 tablet (0.4 mg total) under the tongue every 5 (five) minutes as needed for chest pain.   rosuvastatin (CRESTOR) 20 MG tablet Take 20 mg by mouth daily.      EKGs/Labs/Other Studies Reviewed:    The following studies were reviewed today:  Cardiac Studies & Procedures         CT SCANS  CT CORONARY MORPH W/CTA COR  W/SCORE 03/06/2021  Addendum 03/06/2021  3:45 PM ADDENDUM REPORT: 03/06/2021 15:42  HISTORY: 62 yo female with chest pain, nonspecific  EXAM: Cardiac/Coronary CTA  TECHNIQUE: The patient was scanned on a [employer].  PROTOCOL: A 120 kV prospective scan was triggered in the descending thoracic aorta at 111 HU's. Axial non-contrast 3 mm slices were carried out through the heart. The data set was analyzed on a dedicated work station and scored using the Agatson method. Gantry rotation speed was 250 msecs and collimation was .6 mm. Beta blockade and 0.8 mg of sl NTG was given. The 3D data set was  reconstructed in 5% intervals of the 67-82 % of the R-R cycle. Diastolic phases were analyzed on a dedicated work station using MPR, MIP and VRT modes. The patient received OMNIPAQUE IOHEXOL 350 MG/ML SOLN of contrast.  FINDINGS: Quality: Very good, HR 52  Coronary calcium score: The patient's coronary artery calcium score is 153, which places the patient in the 92rd percentile.  Coronary arteries: Normal coronary origins.  Right dominance.  Right Coronary Artery: Dominant.  Normal vessel.  Left Main Coronary Artery: Normal. Bifurcates into the LAD and LCx arteries.  Left Anterior Descending Coronary Artery: Large anterior vessel that wraps around the apex. There is minimal mixed proximal 1-24% stenosis (CADRADS1). No diagonal branches.  Left Circumflex Artery: AV groove vessel with minimal mixed 1-24% proximal and mid-vessel stensos (CADRADS1). There is a large, high OM1 branch that parallels the LAD and turns toward the lateral wall. There is mild proximal mixed 24-49% stenosis (CADRADS2). Small OM2 branch without disease.  Aorta: Normal size, 26 mm at the mid ascending aorta (level of the PA bifurcation) measured double oblique. No calcifications. No dissection.  Aortic Valve: Trileaflet.  No calcifications.  Other findings:  Normal pulmonary vein drainage into the left atrium.  Normal left atrial appendage without a thrombus.  Normal size of the pulmonary artery.  IMPRESSION: 1. Minimal to mild mixed non-obstructive CAD, CADRADS = 2.  2. Coronary calcium score of 153. This was 92rd percentile for age and sex matched control.  3. Normal coronary origin with right dominance.  4. Aggressive cardiovascular risk factor modification is recommended.   Electronically Signed By: Chrystie Nose M.D. On: 03/06/2021 15:42  Narrative EXAM: OVER-READ INTERPRETATION  CT CHEST  The following report is an over-read performed by radiologist Dr. Trudie Reed  of [facility] on 03/06/2021. This over-read does not include interpretation of cardiac or coronary anatomy or pathology. The coronary calcium score/coronary CTA interpretation by the cardiologist is attached.  COMPARISON:  None.  FINDINGS: Within the visualized portions of the thorax there are no suspicious appearing pulmonary nodules or masses, there is no acute consolidative airspace disease, no pleural effusions, no pneumothorax and no lymphadenopathy. Visualized portions of the upper abdomen are unremarkable. There are no aggressive appearing lytic or blastic lesions noted in the visualized portions of the skeleton.  IMPRESSION: No significant incidental noncardiac findings are noted.  Electronically Signed: By: Trudie Reed M.D. On: 03/06/2021 14:34          EKG Interpretation Date/Time:  Wednesday October 07 2023 14:28:54 EST Ventricular Rate:  69 PR Interval:  190 QRS Duration:  82 QT Interval:  378 QTC Calculation: 405 R Axis:   9  Text Interpretation: Normal sinus rhythm Low voltage QRS Poor R wave progression Late transition No previous ECGs available Confirmed by Norman Herrlich ([id number]) on 10/07/2023 2:40:21 PM    Physical Exam:  VS:  BP 132/76   Pulse 69   Ht 5\' 4"  (1.626 m)   Wt 216 lb 12.8 oz (98.3 kg)   SpO2 94%   BMI 37.21 kg/m     Wt Readings from Last 3 Encounters:  10/07/23 216 lb 12.8 oz (98.3 kg)  09/17/22 226 lb (102.5 kg)  03/26/21 228 lb 3.2 oz (103.5 kg)     GEN:  Well nourished, well developed in no acute distress HEENT: Normal NECK: No JVD; No carotid bruits LYMPHATICS: No lymphadenopathy CARDIAC: RRR, no murmurs, rubs, gallops RESPIRATORY:  Clear to auscultation without rales, wheezing or rhonchi  ABDOMEN: Soft, non-tender, non-distended MUSCULOSKELETAL:  No edema; No deformity  SKIN: Warm and dry NEUROLOGIC:  Alert and oriented x 3 PSYCHIATRIC:  Normal affect    Signed, Norman Herrlich, MD  10/07/2023 3:00 PM     [facility]

## 2023-10-07 NOTE — Patient Instructions (Addendum)
Medication Instructions:  Your physician recommends that you continue on your current medications as directed. Please refer to the Current Medication list given to you today.  *If you need a refill on your cardiac medications before your next appointment, please call your pharmacy*   Lab Work: None If you have labs (blood work) drawn today and your tests are completely normal, you will receive your results only by: MyChart Message (if you have MyChart) OR A paper copy in the mail If you have any lab test that is abnormal or we need to change your treatment, we will call you to review the results.   Testing/Procedures: None   Follow-Up: At [facility], you and your health needs are our priority.  As part of our continuing mission to provide you with exceptional heart care, we have created designated Provider Care Teams.  These Care Teams include your primary Cardiologist (physician) and Advanced Practice Providers (APPs -  Physician Assistants and Nurse Practitioners) who all work together to provide you with the care you need, when you need it.  We recommend signing up for the patient portal called "MyChart".  Sign up information is provided on this After Visit Summary.  MyChart is used to connect with patients for Virtual Visits (Telemedicine).  Patients are able to view lab/test results, encounter notes, upcoming appointments, etc.  Non-urgent messages can be sent to your provider as well.   To learn more about what you can do with MyChart, go to [email or web address].    Your next appointment:   1 year(s)  Provider:   Norman Herrlich, MD    Other Instructions Purchase an [employer] blood pressure cuff  Check blood pressure daily for 2 weeks and after 2 weeks drop off a list of blood pressures to the office

## 2023-12-04 ENCOUNTER — Other Ambulatory Visit: Payer: Self-pay | Admitting: Cardiology

## 2023-12-04 DIAGNOSIS — I10 Essential (primary) hypertension: Secondary | ICD-10-CM

## 2023-12-04 DIAGNOSIS — I2510 Atherosclerotic heart disease of native coronary artery without angina pectoris: Secondary | ICD-10-CM

## 2024-08-02 ENCOUNTER — Other Ambulatory Visit: Payer: Self-pay | Admitting: Family

## 2024-08-02 DIAGNOSIS — Z1231 Encounter for screening mammogram for malignant neoplasm of breast: Secondary | ICD-10-CM

## 2024-09-05 ENCOUNTER — Other Ambulatory Visit: Payer: Self-pay | Admitting: Cardiology

## 2024-09-05 DIAGNOSIS — I10 Essential (primary) hypertension: Secondary | ICD-10-CM

## 2024-09-05 DIAGNOSIS — I2510 Atherosclerotic heart disease of native coronary artery without angina pectoris: Secondary | ICD-10-CM

## 2024-09-28 ENCOUNTER — Ambulatory Visit
Admission: RE | Admit: 2024-09-28 | Discharge: 2024-09-28 | Disposition: A | Discharge status: Home / Self Care | Source: Ambulatory Visit | Attending: Family | Admitting: Family

## 2024-09-28 DIAGNOSIS — Z1231 Encounter for screening mammogram for malignant neoplasm of breast: Secondary | ICD-10-CM

## 2024-10-02 ENCOUNTER — Other Ambulatory Visit: Payer: Self-pay | Admitting: Cardiology

## 2024-10-02 DIAGNOSIS — I2510 Atherosclerotic heart disease of native coronary artery without angina pectoris: Secondary | ICD-10-CM

## 2024-10-02 DIAGNOSIS — I10 Essential (primary) hypertension: Secondary | ICD-10-CM

## 2024-10-17 NOTE — Progress Notes (Unsigned)
 " Cardiology Office Note:    Date:  10/17/2024   ID:  Molly Wilkins, DOB 04-Jan-1961, MRN [id number]  PCP:  Gayl Males, MD  Cardiologist:  Redell Leiter, MD    Referring MD: Gayl Males, MD    ASSESSMENT:    1. Mild CAD   2. Primary hypertension   3. Mixed hyperlipidemia    PLAN:    In order of problems listed above:  ***   Next appointment: ***   Medication Adjustments/Labs and Tests Ordered: Current medicines are reviewed at length with the patient today.  Concerns regarding medicines are outlined above.  No orders of the defined types were placed in this encounter.  No orders of the defined types were placed in this encounter.    History of Present Illness:    Molly Wilkins is a 64 y.o. female with a hx of mild nonobstructive CAD on cardiac CTA June 2022 with elevated calcium score 92nd percentile/153 hypertension and hyperlipidemia last seen 10/07/2023. Compliance with diet, lifestyle and medications: *** Past Medical History:  Diagnosis Date   Diabetes mellitus (HCC) 10/08/2017   Diverticulosis 10/08/2017   Hyperlipidemia 10/08/2017   Hypertension 10/08/2017   Hypothyroidism 10/08/2017    Current Medications: Active Medications[1]    EKGs/Labs/Other Studies Reviewed:    The following studies were reviewed today:  Cardiac Studies & Procedures   ______________________________________________________________________________________________          CT SCANS  CT CORONARY MORPH W/CTA COR W/SCORE 03/06/2021  Addendum 03/06/2021  3:45 PM ADDENDUM REPORT: 03/06/2021 15:42  HISTORY: 64 yo female with chest pain, nonspecific  EXAM: Cardiac/Coronary CTA  TECHNIQUE: The patient was scanned on a [employer].  PROTOCOL: A 120 kV prospective scan was triggered in the descending thoracic aorta at 111 HU's. Axial non-contrast 3 mm slices were carried out through the heart. The data set was analyzed on a dedicated work station and scored  using the Agatson method. Gantry rotation speed was 250 msecs and collimation was .6 mm. Beta blockade and 0.8 mg of sl NTG was given. The 3D data set was reconstructed in 5% intervals of the 67-82 % of the R-R cycle. Diastolic phases were analyzed on a dedicated work station using MPR, MIP and VRT modes. The patient received 100mL OMNIPAQUE  IOHEXOL  350 MG/ML SOLN of contrast.  FINDINGS: Quality: Very good, HR 52  Coronary calcium score: The patient's coronary artery calcium score is 153, which places the patient in the 92rd percentile.  Coronary arteries: Normal coronary origins.  Right dominance.  Right Coronary Artery: Dominant.  Normal vessel.  Left Main Coronary Artery: Normal. Bifurcates into the LAD and LCx arteries.  Left Anterior Descending Coronary Artery: Large anterior vessel that wraps around the apex. There is minimal mixed proximal 1-24% stenosis (CADRADS1). No diagonal branches.  Left Circumflex Artery: AV groove vessel with minimal mixed 1-24% proximal and mid-vessel stensos (CADRADS1). There is a large, high OM1 branch that parallels the LAD and turns toward the lateral wall. There is mild proximal mixed 24-49% stenosis (CADRADS2). Small OM2 branch without disease.  Aorta: Normal size, 26 mm at the mid ascending aorta (level of the PA bifurcation) measured double oblique. No calcifications. No dissection.  Aortic Valve: Trileaflet.  No calcifications.  Other findings:  Normal pulmonary vein drainage into the left atrium.  Normal left atrial appendage without a thrombus.  Normal size of the pulmonary artery.  IMPRESSION: 1. Minimal to mild mixed non-obstructive CAD, CADRADS = 2.  2. Coronary calcium score of 153.  This was 92rd percentile for age and sex matched control.  3. Normal coronary origin with right dominance.  4. Aggressive cardiovascular risk factor modification is recommended.   Electronically Signed By: Vinie JAYSON Maxcy M.D. On:  03/06/2021 15:42  Narrative EXAM: OVER-READ INTERPRETATION  CT CHEST  The following report is an over-read performed by radiologist Dr. Toribio Aye of [facility] on 03/06/2021. This over-read does not include interpretation of cardiac or coronary anatomy or pathology. The coronary calcium score/coronary CTA interpretation by the cardiologist is attached.  COMPARISON:  None.  FINDINGS: Within the visualized portions of the thorax there are no suspicious appearing pulmonary nodules or masses, there is no acute consolidative airspace disease, no pleural effusions, no pneumothorax and no lymphadenopathy. Visualized portions of the upper abdomen are unremarkable. There are no aggressive appearing lytic or blastic lesions noted in the visualized portions of the skeleton.  IMPRESSION: No significant incidental noncardiac findings are noted.  Electronically Signed: By: Toribio Aye M.D. On: 03/06/2021 14:34     ______________________________________________________________________________________________          Recent Labs: No results found for requested labs within last 365 days.  Recent Lipid Panel No results found for: CHOL, TRIG, HDL, CHOLHDL, VLDL, LDLCALC, LDLDIRECT  Physical Exam:    VS:  There were no vitals taken for this visit.    Wt Readings from Last 3 Encounters:  10/07/23 216 lb 12.8 oz (98.3 kg)  09/17/22 226 lb (102.5 kg)  03/26/21 228 lb 3.2 oz (103.5 kg)     GEN: *** Well nourished, well developed in no acute distress HEENT: Normal NECK: No JVD; No carotid bruits LYMPHATICS: No lymphadenopathy CARDIAC: ***RRR, no murmurs, rubs, gallops RESPIRATORY:  Clear to auscultation without rales, wheezing or rhonchi  ABDOMEN: Soft, non-tender, non-distended MUSCULOSKELETAL:  No edema; No deformity  SKIN: Warm and dry NEUROLOGIC:  Alert and oriented x 3 PSYCHIATRIC:  Normal affect    Signed, Redell Leiter, MD   10/17/2024 11:14 AM    [facility]     [1]  No outpatient medications have been marked as taking for the 10/18/24 encounter (Appointment) with Leiter Redell PARAS, MD.   "

## 2024-10-18 ENCOUNTER — Ambulatory Visit: Admitting: Cardiology

## 2024-10-18 DIAGNOSIS — I10 Essential (primary) hypertension: Secondary | ICD-10-CM

## 2024-10-18 DIAGNOSIS — E782 Mixed hyperlipidemia: Secondary | ICD-10-CM

## 2024-10-18 DIAGNOSIS — I2510 Atherosclerotic heart disease of native coronary artery without angina pectoris: Secondary | ICD-10-CM

## 2024-12-12 ENCOUNTER — Ambulatory Visit: Admitting: Cardiology
